# Patient Record
Sex: Male | Born: 2000 | Race: White | Hispanic: No | Marital: Single | Smoking: Never smoker
Health system: Southern US, Community
[De-identification: ages and names within clinical notes are randomized; demographics above are authoritative.]

---
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
## 2021-04-01 IMAGING — US US SCROTUM W/ DOPPLER COMPLETE
1 series · 14 of 25 positions shown · non-contrast
Comparison: None.

CLINICAL DATA: Testicular pain

EXAM:
SCROTAL ULTRASOUND
DOPPLER ULTRASOUND OF THE TESTICLES
TECHNIQUE: Complete ultrasound examination of the testicles, epididymis, and
other scrotal structures was performed. Color and spectral Doppler
ultrasound were also utilized to evaluate blood flow to the
testicles.

[Series 1: us scrotum w/doppler · 14 of 54 slices shown]
[im 1/54]
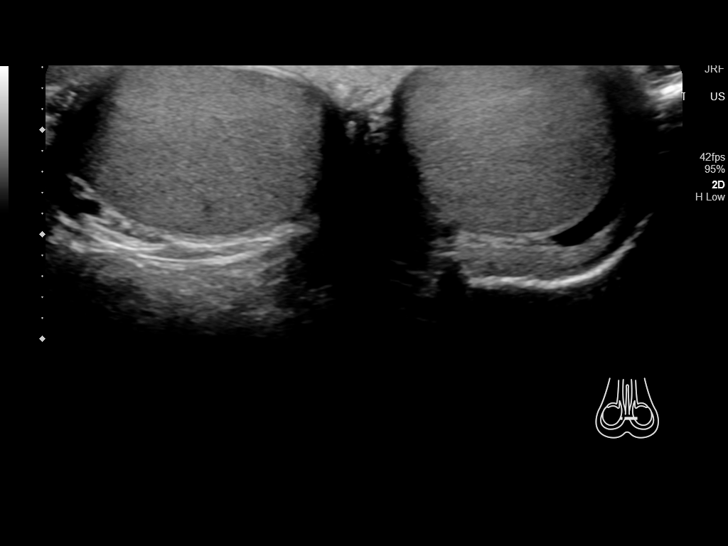
[im 5/54]
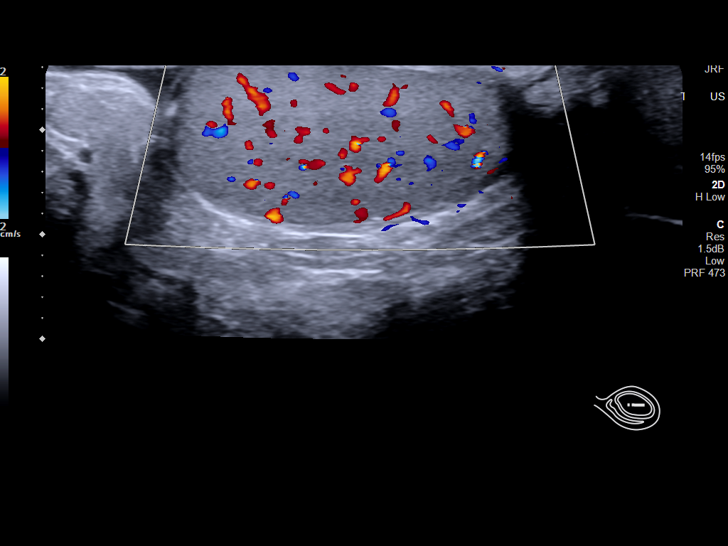
[im 9/54]
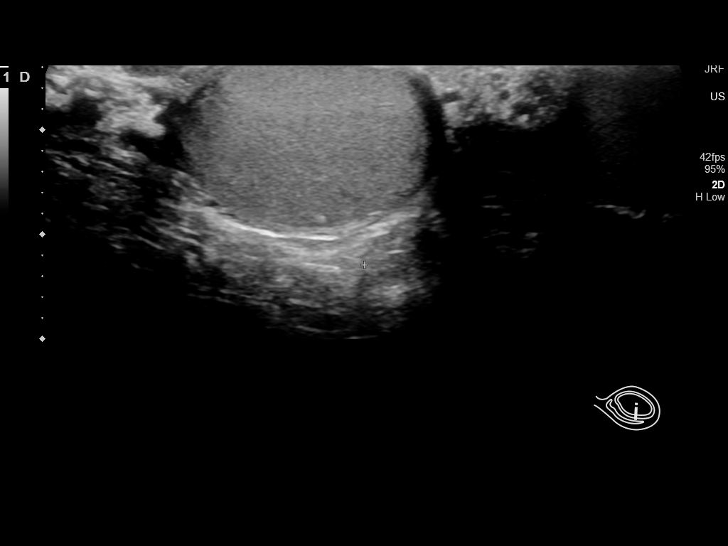
[im 14/54]
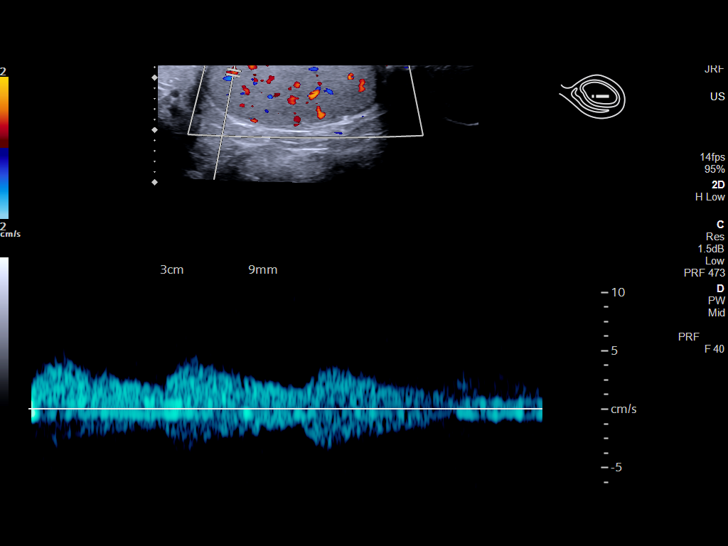
[im 18/54]
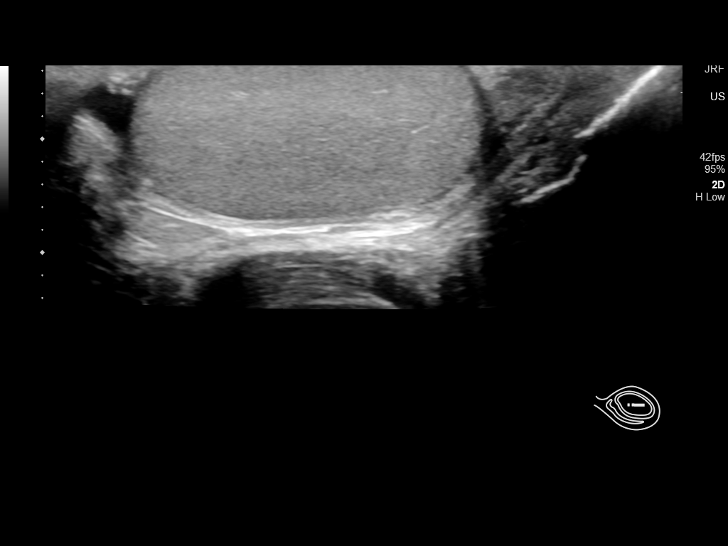
[im 20/54]
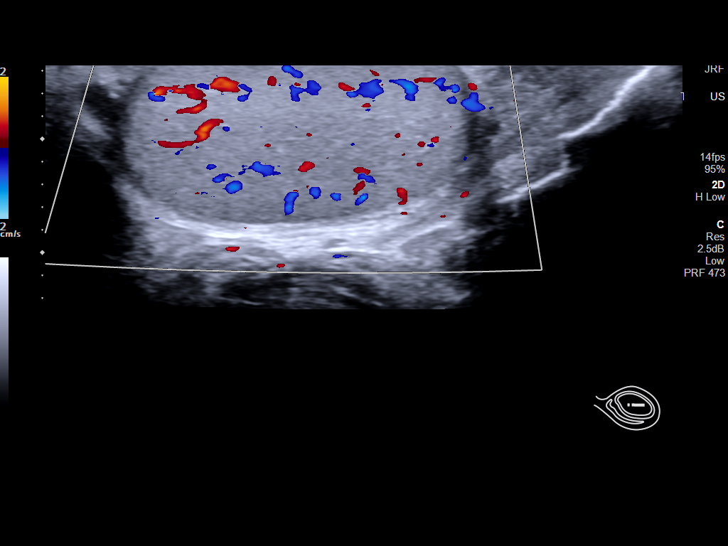
[im 25/54]
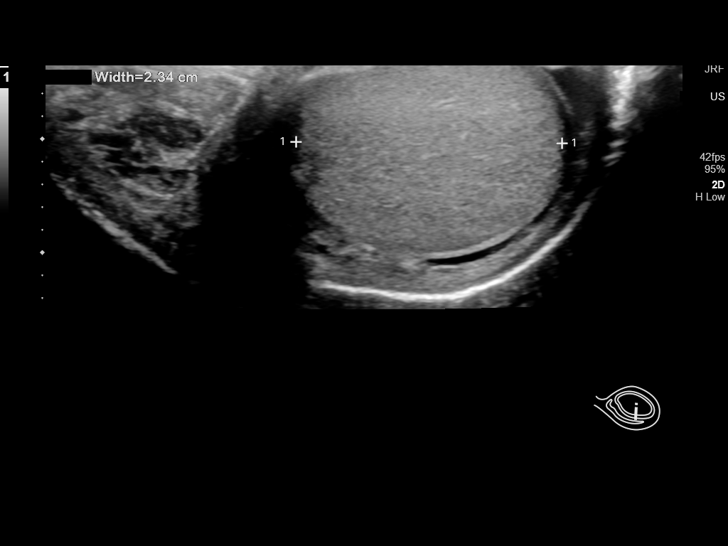
[im 29/54]
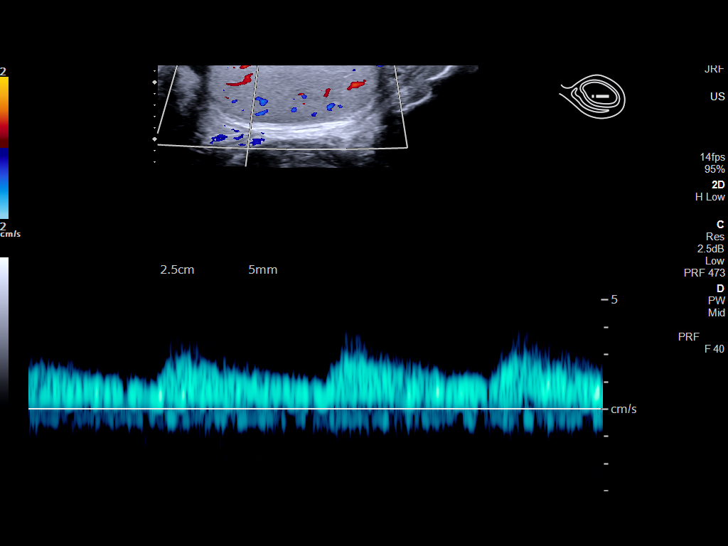
[im 34/54]
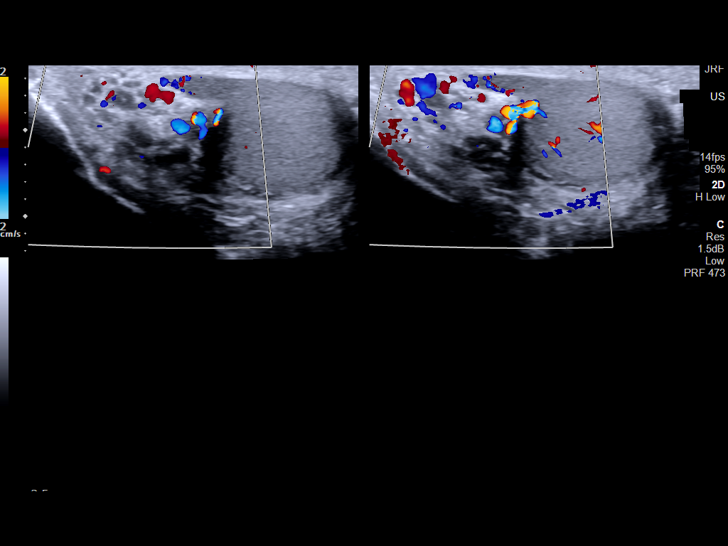
[im 36/54]
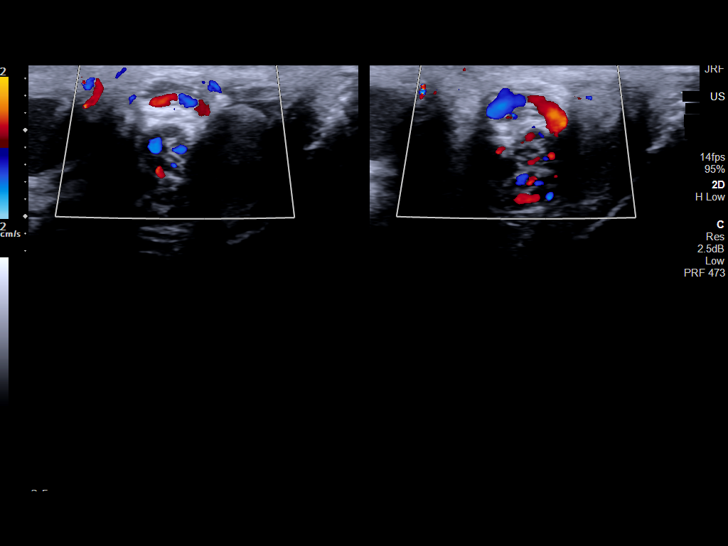
[im 40/54]
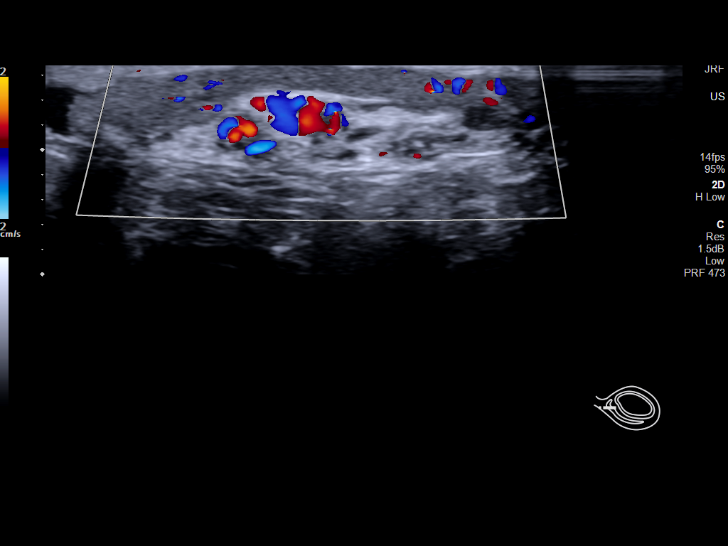
[im 45/54]
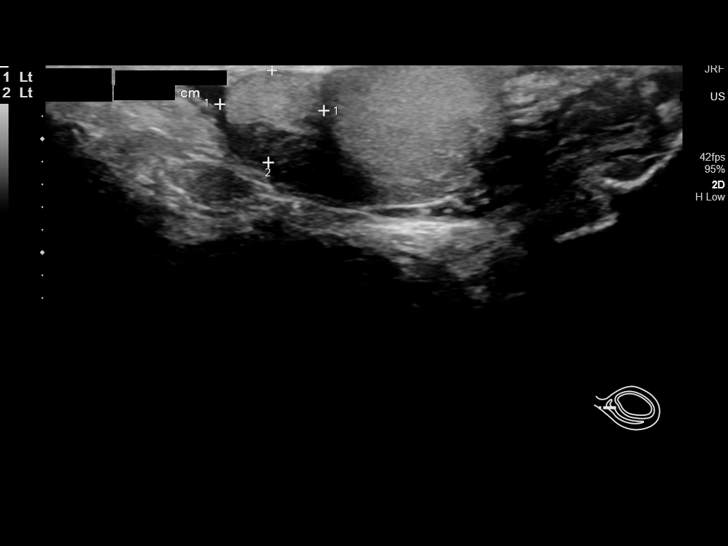
[im 49/54]
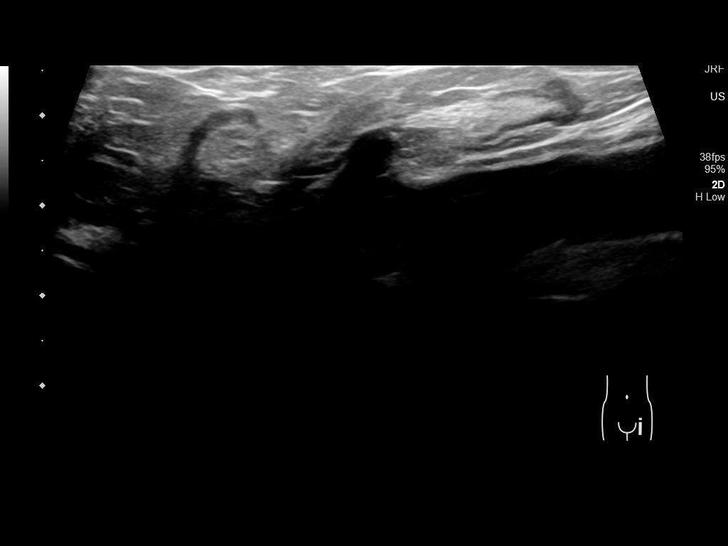
[im 54/54]
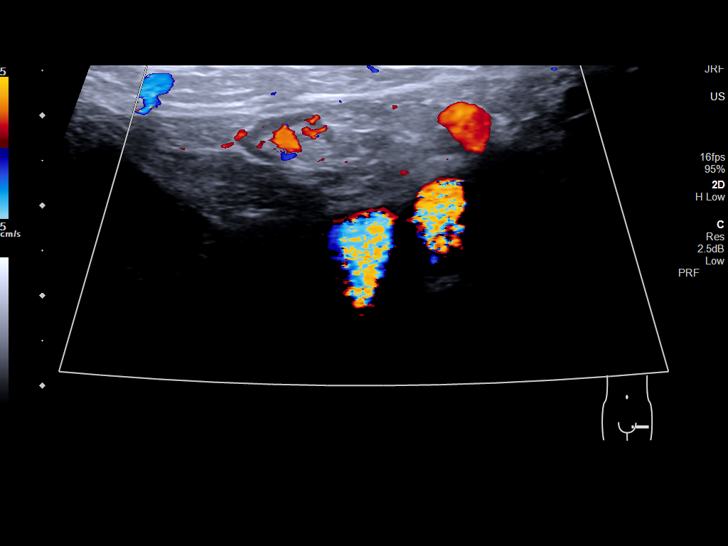

[14 of 25 positions shown; findings below may reference images not displayed]

FINDINGS: Right testicle

Measurements: 3.4 x 1.7 x 2.4 cm. No mass or microlithiasis
visualized.

Left testicle

Measurements: 3.1 x 1.4 x 2.3 cm. No mass or microlithiasis
visualized.

Right epididymis:  Normal in size and appearance.

Left epididymis:  Normal in size and appearance.

Hydrocele:  Small left hydrocele

Varicocele:  None visualized.

Pulsed Doppler interrogation of both testes demonstrates normal low
resistance arterial and venous waveforms bilaterally.
IMPRESSION: 1. Small left hydrocele.
2. Normal blood flow to both testicles during the examination.
3. Normal sonographic appearance of the testicles and epididymi.

## 2021-08-18 ENCOUNTER — Other Ambulatory Visit: Payer: Self-pay

## 2021-08-18 ENCOUNTER — Emergency Department
Admission: EM | Admit: 2021-08-18 | Discharge: 2021-08-18 | Disposition: A | Discharge status: Home / Self Care | Payer: No Typology Code available for payment source | Attending: Emergency Medicine | Admitting: Emergency Medicine

## 2021-08-18 DIAGNOSIS — W228XXA Striking against or struck by other objects, initial encounter: Secondary | ICD-10-CM | POA: Insufficient documentation

## 2021-08-18 DIAGNOSIS — Y9389 Activity, other specified: Secondary | ICD-10-CM | POA: Insufficient documentation

## 2021-08-18 DIAGNOSIS — S0502XA Injury of conjunctiva and corneal abrasion without foreign body, left eye, initial encounter: Secondary | ICD-10-CM | POA: Insufficient documentation

## 2021-08-18 MED ORDER — POLYMYXIN B-TRIMETHOPRIM 10000-0.1 UNIT/ML-% OP SOLN: 1.0000 [drp] | Freq: Four times a day (QID) | OPHTHALMIC | 0 refills | Status: AC

## 2021-08-18 MED ORDER — FLUORESCEIN SODIUM 1 MG OP STRP
1.0000 | ORAL_STRIP | Freq: Once | OPHTHALMIC | Status: AC
  Administered 2021-08-18: 1 via OPHTHALMIC
  Filled 2021-08-18: qty 1

## 2021-08-18 MED ORDER — TETRACAINE HCL 0.5 % OP SOLN
2.0000 [drp] | Freq: Once | OPHTHALMIC | Status: AC
  Administered 2021-08-18: 2 [drp] via OPHTHALMIC
  Filled 2021-08-18: qty 4

## 2021-08-18 MED ORDER — KETOROLAC TROMETHAMINE 0.5 % OP SOLN: 1.0000 [drp] | Freq: Four times a day (QID) | OPHTHALMIC | 0 refills | Status: AC

## 2021-08-18 NOTE — ED Provider Notes (Signed)
[facility] Provider Note  Patient Contact: 9:32 PM (approximate)   History   Eye Injury   HPI  Bruce Lowery is a 21 y.o. male who presents the emergency department complaining of left eye pain.  Patient was struck in the left eye with a pipe.  Patient is having some slight blurred vision to the left eye.  He does not wear contacts but does wear glasses.  Patient states that his eye is somewhat sensitive to the light.  No medications prior to arrival.  No other injury or complaint.     Physical Exam   Triage Vital Signs: ED Triage Vitals [08/18/21 2012]  Enc Vitals Group     BP 135/77     Pulse Rate 82     Resp 17     Temp 98 F (36.7 C)     Temp Source Oral     SpO2 99 %     Weight      Height      Head Circumference      Peak Flow      Pain Score 6     Pain Loc      Pain Edu?      Excl. in [place]?     Most recent vital signs: Vitals:   08/18/21 2012  BP: 135/77  Pulse: 82  Resp: 17  Temp: 98 F (36.7 C)  SpO2: 99%     General: Alert and in no acute distress. Eyes:  PERRL. EOMI. visualization on funduscopic exam reveals no evidence of gross retained foreign body.  Funduscopic exam of both eyes reveals red reflex, vasculature and optic disc intact.  No evidence of hyphema.  Left eye is anesthetized using tetracaine drops.  Fluorescein staining applied.  There is a broad area across the iris extending into the pupil consistent with corneal abrasion.  No retained foreign body.   Cardiovascular:  Good peripheral perfusion Respiratory: Normal respiratory effort without tachypnea or retractions. Lungs CTAB.  Musculoskeletal: Full range of motion to all extremities.  Neurologic:  No gross focal neurologic deficits are appreciated.  Skin:   No rash noted Other:   ED Results / Procedures / Treatments   Labs (all labs ordered are listed, but only abnormal results are displayed) Labs Reviewed - No data to  display   EKG     RADIOLOGY    No results found.  PROCEDURES:  Critical Care performed: No  Procedures   MEDICATIONS ORDERED IN ED: Medications  tetracaine (PONTOCAINE) 0.5 % ophthalmic solution 2 drop (has no administration in time range)  fluorescein ophthalmic strip 1 strip (has no administration in time range)     IMPRESSION / MDM / ASSESSMENT AND PLAN / ED COURSE  I reviewed the triage vital signs and the nursing notes.                              Differential diagnosis includes, but is not limited to, corneal abrasion, retained foreign body, ocular trauma/hyphema/globe rupture   Patient's diagnosis is consistent with corneal abrasion.  Patient presents the emergency department with blurred vision and pain to the left eye after being struck in the eye with a pipe.  No evidence of globe rupture or hyphema on exam.  Patient does have what appears to be a corneal abrasion based off of forcing staining.  No areas of uptake concerning for retained foreign body.  Patient will be  treated with antibiotic eyedrops and Acular for symptom relief.  Patient may also use Visine, normal saline or artificial tears for additional symptom control.  Follow-up with ophthalmology if needed.  Return precautions discussed with the patient..  Patient is given ED precautions to return to the ED for any worsening or new symptoms.        FINAL CLINICAL IMPRESSION(S) / ED DIAGNOSES   Final diagnoses:  Abrasion of left cornea, initial encounter     Rx / DC Orders   ED Discharge Orders          Ordered    ketorolac (ACULAR) 0.5 % ophthalmic solution  4 times daily        08/18/21 2139    trimethoprim-polymyxin b (POLYTRIM) ophthalmic solution  Every 6 hours        08/18/21 2139             Note:  This document was prepared using Dragon voice recognition software and may include unintentional dictation errors.   Brynda Peon 08/18/21 2139    Duffy Bruce, MD 08/18/21 2217

## 2021-08-18 NOTE — ED Triage Notes (Signed)
Pt was working and a pipe popped up hitting him in the left eye.  Eye is red. Hard to see because his eye is tearing.

## 2023-04-30 ENCOUNTER — Telehealth: Payer: Self-pay

## 2023-05-03 NOTE — Telephone Encounter (Signed)
Created in error
